# Patient Record
Sex: Female | Born: 1940 | Race: White | Hispanic: No | Smoking: Never smoker
Health system: Southern US, Community
[De-identification: ages and names within clinical notes are randomized; demographics above are authoritative.]

## PROBLEM LIST (undated history)

## (undated) DIAGNOSIS — C50919 Malignant neoplasm of unspecified site of unspecified female breast: Secondary | ICD-10-CM

## (undated) DIAGNOSIS — G9340 Encephalopathy, unspecified: Secondary | ICD-10-CM

## (undated) DIAGNOSIS — R2681 Unsteadiness on feet: Secondary | ICD-10-CM

## (undated) DIAGNOSIS — G249 Dystonia, unspecified: Secondary | ICD-10-CM

## (undated) DIAGNOSIS — W19XXXA Unspecified fall, initial encounter: Secondary | ICD-10-CM

## (undated) HISTORY — PX: TONSILLECTOMY: SUR1361

## (undated) HISTORY — PX: DEEP BRAIN STIMULATOR PLACEMENT: SHX608

## (undated) HISTORY — PX: APPENDECTOMY: SHX54

---

## 2014-06-08 ENCOUNTER — Encounter (HOSPITAL_COMMUNITY): Payer: Self-pay | Admitting: Emergency Medicine

## 2014-06-08 ENCOUNTER — Emergency Department (HOSPITAL_COMMUNITY)
Admission: EM | Admit: 2014-06-08 | Discharge: 2014-06-09 | Disposition: A | Discharge status: Home / Self Care | Payer: Medicare Other | Attending: Emergency Medicine | Admitting: Emergency Medicine

## 2014-06-08 ENCOUNTER — Emergency Department (HOSPITAL_COMMUNITY): Payer: Medicare Other

## 2014-06-08 DIAGNOSIS — Z8669 Personal history of other diseases of the nervous system and sense organs: Secondary | ICD-10-CM | POA: Diagnosis not present

## 2014-06-08 DIAGNOSIS — Y929 Unspecified place or not applicable: Secondary | ICD-10-CM | POA: Diagnosis not present

## 2014-06-08 DIAGNOSIS — Z853 Personal history of malignant neoplasm of breast: Secondary | ICD-10-CM | POA: Diagnosis not present

## 2014-06-08 DIAGNOSIS — Y9389 Activity, other specified: Secondary | ICD-10-CM | POA: Diagnosis not present

## 2014-06-08 DIAGNOSIS — W19XXXA Unspecified fall, initial encounter: Secondary | ICD-10-CM

## 2014-06-08 DIAGNOSIS — W01198A Fall on same level from slipping, tripping and stumbling with subsequent striking against other object, initial encounter: Secondary | ICD-10-CM | POA: Diagnosis not present

## 2014-06-08 DIAGNOSIS — Z9181 History of falling: Secondary | ICD-10-CM | POA: Insufficient documentation

## 2014-06-08 DIAGNOSIS — S12000K Unspecified displaced fracture of first cervical vertebra, subsequent encounter for fracture with nonunion: Secondary | ICD-10-CM | POA: Diagnosis not present

## 2014-06-08 DIAGNOSIS — S51011A Laceration without foreign body of right elbow, initial encounter: Secondary | ICD-10-CM | POA: Diagnosis not present

## 2014-06-08 DIAGNOSIS — S0990XA Unspecified injury of head, initial encounter: Secondary | ICD-10-CM | POA: Insufficient documentation

## 2014-06-08 HISTORY — DX: Unspecified fall, initial encounter: W19.XXXA

## 2014-06-08 HISTORY — DX: Unsteadiness on feet: R26.81

## 2014-06-08 HISTORY — DX: Encephalopathy, unspecified: G93.40

## 2014-06-08 HISTORY — DX: Dystonia, unspecified: G24.9

## 2014-06-08 HISTORY — DX: Malignant neoplasm of unspecified site of unspecified female breast: C50.919

## 2014-06-08 MED ORDER — HYDROMORPHONE HCL 1 MG/ML IJ SOLN
1.0000 mg | Freq: Once | INTRAMUSCULAR | Status: AC
  Administered 2014-06-08: 1 mg via INTRAMUSCULAR
  Filled 2014-06-08: qty 1

## 2014-06-08 MED ORDER — LIDOCAINE HCL (PF) 1 % IJ SOLN
INTRAMUSCULAR | Status: AC
  Filled 2014-06-08: qty 5

## 2014-06-08 MED ORDER — LIDOCAINE HCL (PF) 1 % IJ SOLN
5.0000 mL | Freq: Once | INTRAMUSCULAR | Status: AC
Start: 2014-06-08 — End: 2014-06-08
  Administered 2014-06-08: 5 mL via INTRADERMAL

## 2014-06-08 NOTE — ED Provider Notes (Addendum)
CSN: [id number]     Arrival date & time 06/08/14  2205 History   First MD Initiated Contact with Patient 06/08/14 2213     Chief Complaint  Patient presents with  . Fall     (Consider location/radiation/quality/duration/timing/severity/associated sxs/prior Treatment) HPI Comments: Patient is a 73 year old female with history of dystonia with deep brain stimulator, breast cancer, recent C1 fracture. She is brought by ambulance after a fall at the rehabilitation facility. She is getting up to go the bathroom when she lost her balance and fell and struck her head. She denies loss of consciousness but complains of headache and pain to the back of her neck. She denies any numbness or tingling. She denies any other injury.  Patient is a 73 y.o. female presenting with fall. The history is provided by the patient.  Fall This is a new problem. The current episode started less than 1 hour ago. The problem occurs constantly. The problem has not changed since onset.Associated symptoms include headaches. Pertinent negatives include no chest pain. Nothing aggravates the symptoms. Nothing relieves the symptoms. She has tried nothing for the symptoms. The treatment provided no relief.    Past Medical History  Diagnosis Date  . Dystonia   . Breast CA   . Encephalopathy   . Fall   . Unsteadiness on feet    Past Surgical History  Procedure Laterality Date  . Tonsillectomy    . Appendectomy     No family history on file. History  Substance Use Topics  . Smoking status: Never Smoker   . Smokeless tobacco: Not on file  . Alcohol Use: Yes     Comment: occassionally   OB History   Grav Para Term Preterm Abortions TAB SAB Ect Mult Living                 Review of Systems  Cardiovascular: Negative for chest pain.  Neurological: Positive for headaches.  All other systems reviewed and are negative.     Allergies  Benzoin; Latex; and Tape  Home Medications   Prior to Admission medications    Not on File   BP 149/69  Pulse 84  Temp(Src) 98.2 F (36.8 C) (Oral)  Resp 16  SpO2 100% Physical Exam  Nursing note and vitals reviewed. Constitutional: She is oriented to person, place, and time. She appears well-developed and well-nourished. No distress.  HENT:  Head: Normocephalic and atraumatic.  Eyes: EOM are normal. Pupils are equal, round, and reactive to light.  Neck: Normal range of motion. Neck supple.  Cardiovascular: Normal rate and regular rhythm.  Exam reveals no gallop and no friction rub.   No murmur heard. Pulmonary/Chest: Effort normal and breath sounds normal. No respiratory distress. She has no wheezes.  Abdominal: Soft. Bowel sounds are normal. She exhibits no distension. There is no tenderness.  Musculoskeletal: Normal range of motion.  There is a 5 cm laceration to the right elbow. She has good range of motion and pulses, motor, and sensory are intact distally  Neurological: She is alert and oriented to person, place, and time. No cranial nerve deficit. She exhibits normal muscle tone. Coordination normal.  Skin: Skin is warm and dry. She is not diaphoretic.    ED Course  Procedures (including critical care time) Labs Review Labs Reviewed - No data to display  Imaging Review No results found.   EKG Interpretation None     LACERATION REPAIR Performed by: Veryl Speak Authorized by: Veryl Speak Consent: Verbal consent obtained.  Risks and benefits: risks, benefits and alternatives were discussed Consent given by: patient Patient identity confirmed: provided demographic data Prepped and Draped in normal sterile fashion Wound explored  Laceration Location: right elbow  Laceration Length: 5cm  No Foreign Bodies seen or palpated  Anesthesia: local infiltration  Local anesthetic: lidocaine 1% without epinephrine  Anesthetic total: 5 ml  Irrigation method: syringe Amount of cleaning: standard  Skin closure: 4-0 prolene  Number of  sutures: 7  Technique: simple interrupted  Patient tolerance: Patient tolerated the procedure well with no immediate complications.   MDM   Final diagnoses:  Fall    Patient presents after a fall at her rehabilitation facility. She is there for recovery from a C1 fracture that was diagnosed in [place], [place]. She was hospitalized there then discharged to this rehabilitation facility. She woke up this evening to go to the bathroom and fell, striking her head. She denies any loss of consciousness but complains of headache. Her head CT is negative for any intracranial injury. CT of the cervical spine does reveal a C1 fracture which appears unchanged from a CT which was performed on October 15. The report from this was in her records that were sent with her. The scan was performed at an outside facility and we do not have access to these images.  I discussed this with Dr. Cyndy Freeze from neurosurgery who is recommending her wear her collar and does not feel as though any acute intervention is indicated. She will be discharged to her rehabilitation facility.    Veryl Speak, MD 06/09/14 0923  Veryl Speak, MD 06/09/14 [phone]

## 2014-06-08 NOTE — ED Notes (Signed)
Called CT and spoke to [place] to report patient is ready for transport.

## 2014-06-08 NOTE — ED Notes (Signed)
Dr. Stark Jock at the bedside with the patient.

## 2014-06-08 NOTE — ED Notes (Signed)
Pt arrives via EMS from [facility]. States that she was ambulating to bathroom with walker when she fell inbetween toilet and sink. Lac to rt elbow and abrasions to rt arm. Bruise present to back of neck and ear. Hematoma to rt head present. Small hematoma to lt head which is old from fall last week. Pt has hx frequent falls. Pt has hx C1 collar and typically wears collar. Pt was not wearing her collar at time of fall. EMS applied collar upon their arrival. 130/72 HR 74 98% RA CBG 97.

## 2014-06-08 NOTE — ED Notes (Signed)
Dr. Stark Jock is at the bedside.

## 2014-06-09 MED ORDER — HYDROCODONE-ACETAMINOPHEN 5-325 MG PO TABS: 1.0000 | ORAL_TABLET | Freq: Four times a day (QID) | ORAL | Status: AC | PRN

## 2014-06-09 NOTE — Discharge Instructions (Signed)
Hydrocodone as prescribed as needed for pain.  Continue to wear your cervical collar as before.  Followup with your neurosurgeon next week to be rechecked, and return to the ER if you develop any new or concerning symptoms.   Concussion A concussion, or closed-head injury, is a brain injury caused by a direct blow to the head or by a quick and sudden movement (jolt) of the head or neck. Concussions are usually not life-threatening. Even so, the effects of a concussion can be serious. If you have had a concussion before, you are more likely to experience concussion-like symptoms after a direct blow to the head.  CAUSES  Direct blow to the head, such as from running into another player during a soccer game, being hit in a fight, or hitting your head on a hard surface.  A jolt of the head or neck that causes the brain to move back and forth inside the skull, such as in a car crash. SIGNS AND SYMPTOMS The signs of a concussion can be hard to notice. Early on, they may be missed by you, family members, and health care providers. You may look fine but act or feel differently. Symptoms are usually temporary, but they may last for days, weeks, or even longer. Some symptoms may appear right away while others may not show up for hours or days. Every head injury is different. Symptoms include:  Mild to moderate headaches that will not go away.  A feeling of pressure inside your head.  Having more trouble than usual:  Learning or remembering things you have heard.  Answering questions.  Paying attention or concentrating.  Organizing daily tasks.  Making decisions and solving problems.  Slowness in thinking, acting or reacting, speaking, or reading.  Getting lost or being easily confused.  Feeling tired all the time or lacking energy (fatigued).  Feeling drowsy.  Sleep disturbances.  Sleeping more than usual.  Sleeping less than usual.  Trouble falling asleep.  Trouble sleeping  (insomnia).  Loss of balance or feeling lightheaded or dizzy.  Nausea or vomiting.  Numbness or tingling.  Increased sensitivity to:  Sounds.  Lights.  Distractions.  Vision problems or eyes that tire easily.  Diminished sense of taste or smell.  Ringing in the ears.  Mood changes such as feeling sad or anxious.  Becoming easily irritated or angry for little or no reason.  Lack of motivation.  Seeing or hearing things other people do not see or hear (hallucinations). DIAGNOSIS Your health care provider can usually diagnose a concussion based on a description of your injury and symptoms. He or she will ask whether you passed out (lost consciousness) and whether you are having trouble remembering events that happened right before and during your injury. Your evaluation might include:  A brain scan to look for signs of injury to the brain. Even if the test shows no injury, you may still have a concussion.  Blood tests to be sure other problems are not present. TREATMENT  Concussions are usually treated in an emergency department, in urgent care, or at a clinic. You may need to stay in the hospital overnight for further treatment.  Tell your health care provider if you are taking any medicines, including prescription medicines, over-the-counter medicines, and natural remedies. Some medicines, such as blood thinners (anticoagulants) and aspirin, may increase the chance of complications. Also tell your health care provider whether you have had alcohol or are taking illegal drugs. This information may affect treatment.  Your  health care provider will send you home with important instructions to follow.  How fast you will recover from a concussion depends on many factors. These factors include how severe your concussion is, what part of your brain was injured, your age, and how healthy you were before the concussion.  Most people with mild injuries recover fully. Recovery can  take time. In general, recovery is slower in older persons. Also, persons who have had a concussion in the past or have other medical problems may find that it takes longer to recover from their current injury. HOME CARE INSTRUCTIONS General Instructions  Carefully follow the directions your health care provider gave you.  Only take over-the-counter or prescription medicines for pain, discomfort, or fever as directed by your health care provider.  Take only those medicines that your health care provider has approved.  Do not drink alcohol until your health care provider says you are well enough to do so. Alcohol and certain other drugs may slow your recovery and can put you at risk of further injury.  If it is harder than usual to remember things, write them down.  If you are easily distracted, try to do one thing at a time. For example, do not try to watch TV while fixing dinner.  Talk with family members or close friends when making important decisions.  Keep all follow-up appointments. Repeated evaluation of your symptoms is recommended for your recovery.  Watch your symptoms and tell others to do the same. Complications sometimes occur after a concussion. Older adults with a brain injury may have a higher risk of serious complications, such as a blood clot on the brain.  Tell your teachers, school nurse, school counselor, coach, athletic trainer, or work Freight forwarder about your injury, symptoms, and restrictions. Tell them about what you can or cannot do. They should watch for:  Increased problems with attention or concentration.  Increased difficulty remembering or learning new information.  Increased time needed to complete tasks or assignments.  Increased irritability or decreased ability to cope with stress.  Increased symptoms.  Rest. Rest helps the brain to heal. Make sure you:  Get plenty of sleep at night. Avoid staying up late at night.  Keep the same bedtime hours on  weekends and weekdays.  Rest during the day. Take daytime naps or rest breaks when you feel tired.  Limit activities that require a lot of thought or concentration. These include:  Doing homework or job-related work.  Watching TV.  Working on the computer.  Avoid any situation where there is potential for another head injury (football, hockey, soccer, basketball, martial arts, downhill snow sports and horseback riding). Your condition will get worse every time you experience a concussion. You should avoid these activities until you are evaluated by the appropriate follow-up health care providers. Returning To Your Regular Activities You will need to return to your normal activities slowly, not all at once. You must give your body and brain enough time for recovery.  Do not return to sports or other athletic activities until your health care provider tells you it is safe to do so.  Ask your health care provider when you can drive, ride a bicycle, or operate heavy machinery. Your ability to react may be slower after a brain injury. Never do these activities if you are dizzy.  Ask your health care provider about when you can return to work or school. Preventing Another Concussion It is very important to avoid another brain injury, especially  before you have recovered. In rare cases, another injury can lead to permanent brain damage, brain swelling, or death. The risk of this is greatest during the first 7-10 days after a head injury. Avoid injuries by:  Wearing a seat belt when riding in a car.  Drinking alcohol only in moderation.  Wearing a helmet when biking, skiing, skateboarding, skating, or doing similar activities.  Avoiding activities that could lead to a second concussion, such as contact or recreational sports, until your health care provider says it is okay.  Taking safety measures in your home.  Remove clutter and tripping hazards from floors and stairways.  Use grab bars  in bathrooms and handrails by stairs.  Place non-slip mats on floors and in bathtubs.  Improve lighting in dim areas. SEEK MEDICAL CARE IF:  You have increased problems paying attention or concentrating.  You have increased difficulty remembering or learning new information.  You need more time to complete tasks or assignments than before.  You have increased irritability or decreased ability to cope with stress.  You have more symptoms than before. Seek medical care if you have any of the following symptoms for more than 2 weeks after your injury:  Lasting (chronic) headaches.  Dizziness or balance problems.  Nausea.  Vision problems.  Increased sensitivity to noise or light.  Depression or mood swings.  Anxiety or irritability.  Memory problems.  Difficulty concentrating or paying attention.  Sleep problems.  Feeling tired all the time. SEEK IMMEDIATE MEDICAL CARE IF:  You have severe or worsening headaches. These may be a sign of a blood clot in the brain.  You have weakness (even if only in one hand, leg, or part of the face).  You have numbness.  You have decreased coordination.  You vomit repeatedly.  You have increased sleepiness.  One pupil is larger than the other.  You have convulsions.  You have slurred speech.  You have increased confusion. This may be a sign of a blood clot in the brain.  You have increased restlessness, agitation, or irritability.  You are unable to recognize people or places.  You have neck pain.  It is difficult to wake you up.  You have unusual behavior changes.  You lose consciousness. MAKE SURE YOU:  Understand these instructions.  Will watch your condition.  Will get help right away if you are not doing well or get worse. Document Released: 10/24/2003 Document Revised: 08/08/2013 Document Reviewed: 02/23/2013 [facility] Patient Information 2015 [place], [place]. This information is not intended to  replace advice given to you by your health care provider. Make sure you discuss any questions you have with your health care provider.

## 2014-06-09 NOTE — ED Notes (Signed)
Patient using the bedpan ?

## 2014-06-14 ENCOUNTER — Encounter (HOSPITAL_COMMUNITY): Payer: Self-pay | Admitting: Emergency Medicine

## 2014-06-14 ENCOUNTER — Emergency Department (HOSPITAL_COMMUNITY)
Admission: EM | Admit: 2014-06-14 | Discharge: 2014-06-15 | Disposition: A | Discharge status: Home Health | Payer: Medicare Other | Attending: Emergency Medicine | Admitting: Emergency Medicine

## 2014-06-14 ENCOUNTER — Emergency Department (HOSPITAL_COMMUNITY): Payer: Medicare Other

## 2014-06-14 DIAGNOSIS — S02119D Unspecified fracture of occiput, subsequent encounter for fracture with routine healing: Secondary | ICD-10-CM

## 2014-06-14 DIAGNOSIS — Y9389 Activity, other specified: Secondary | ICD-10-CM | POA: Insufficient documentation

## 2014-06-14 DIAGNOSIS — R296 Repeated falls: Secondary | ICD-10-CM | POA: Insufficient documentation

## 2014-06-14 DIAGNOSIS — Z91040 Latex allergy status: Secondary | ICD-10-CM | POA: Diagnosis not present

## 2014-06-14 DIAGNOSIS — R4789 Other speech disturbances: Secondary | ICD-10-CM | POA: Diagnosis not present

## 2014-06-14 DIAGNOSIS — W1839XA Other fall on same level, initial encounter: Secondary | ICD-10-CM | POA: Insufficient documentation

## 2014-06-14 DIAGNOSIS — S0181XA Laceration without foreign body of other part of head, initial encounter: Secondary | ICD-10-CM | POA: Diagnosis present

## 2014-06-14 DIAGNOSIS — Z853 Personal history of malignant neoplasm of breast: Secondary | ICD-10-CM | POA: Diagnosis not present

## 2014-06-14 DIAGNOSIS — Z8669 Personal history of other diseases of the nervous system and sense organs: Secondary | ICD-10-CM | POA: Diagnosis not present

## 2014-06-14 DIAGNOSIS — R2681 Unsteadiness on feet: Secondary | ICD-10-CM | POA: Diagnosis not present

## 2014-06-14 DIAGNOSIS — Z792 Long term (current) use of antibiotics: Secondary | ICD-10-CM | POA: Insufficient documentation

## 2014-06-14 DIAGNOSIS — Z79899 Other long term (current) drug therapy: Secondary | ICD-10-CM | POA: Insufficient documentation

## 2014-06-14 DIAGNOSIS — Y92198 Other place in other specified residential institution as the place of occurrence of the external cause: Secondary | ICD-10-CM | POA: Diagnosis not present

## 2014-06-14 DIAGNOSIS — S12000D Unspecified displaced fracture of first cervical vertebra, subsequent encounter for fracture with routine healing: Secondary | ICD-10-CM

## 2014-06-14 DIAGNOSIS — W19XXXA Unspecified fall, initial encounter: Secondary | ICD-10-CM

## 2014-06-14 MED ORDER — LORAZEPAM 2 MG/ML IJ SOLN
0.5000 mg | Freq: Once | INTRAMUSCULAR | Status: AC
  Administered 2014-06-14: 0.5 mg via INTRAMUSCULAR
  Filled 2014-06-14: qty 1

## 2014-06-14 MED ORDER — ROPINIROLE HCL 0.5 MG PO TABS
0.5000 mg | ORAL_TABLET | Freq: Once | ORAL | Status: AC
  Administered 2014-06-15: 0.5 mg via ORAL
  Filled 2014-06-14: qty 1

## 2014-06-14 MED ORDER — LIDOCAINE HCL (PF) 1 % IJ SOLN
30.0000 mL | Freq: Once | INTRAMUSCULAR | Status: AC
  Administered 2014-06-15: 30 mL
  Filled 2014-06-14: qty 30

## 2014-06-14 NOTE — ED Notes (Signed)
Per [facility] - pt from [facility] - pt uses a walker to ambulate, this evening while ambulating pt reached down to pick something up off the floor causing pt to fall, pt sustained laceration to forehead d/t glasses. Pt A&O on arrival, EMS report facility staff state pt is at her baseline mental status and has been since the fall. Pt also wearing a soft c-collar d/t previous neck injury. Pt denies neck or back pain.

## 2014-06-15 DIAGNOSIS — S0181XA Laceration without foreign body of other part of head, initial encounter: Secondary | ICD-10-CM | POA: Diagnosis not present

## 2014-06-15 MED ORDER — LORAZEPAM 2 MG/ML IJ SOLN
1.0000 mg | Freq: Once | INTRAMUSCULAR | Status: AC
  Administered 2014-06-15: 1 mg via INTRAMUSCULAR
  Filled 2014-06-15: qty 1

## 2014-06-15 NOTE — ED Notes (Signed)
Pt transported back to [facility] by [facility].

## 2014-06-15 NOTE — Discharge Instructions (Signed)
Fall Prevention and Home Safety Falls cause injuries and can affect all age groups. It is possible to use preventive measures to significantly decrease the likelihood of falls. There are many simple measures which can make your home safer and prevent falls. OUTDOORS  Repair cracks and edges of walkways and driveways.  Remove high doorway thresholds.  Trim shrubbery on the main path into your home.  Have good outside lighting.  Clear walkways of tools, rocks, debris, and clutter.  Check that handrails are not broken and are securely fastened. Both sides of steps should have handrails.  Have leaves, snow, and ice cleared regularly.  Use sand or salt on walkways during winter months.  In the garage, clean up grease or oil spills. BATHROOM  Install night lights.  Install grab bars by the toilet and in the tub and shower.  Use non-skid mats or decals in the tub or shower.  Place a plastic non-slip stool in the shower to sit on, if needed.  Keep floors dry and clean up all water on the floor immediately.  Remove soap buildup in the tub or shower on a regular basis.  Secure bath mats with non-slip, double-sided rug tape.  Remove throw rugs and tripping hazards from the floors. BEDROOMS  Install night lights.  Make sure a bedside light is easy to reach.  Do not use oversized bedding.  Keep a telephone by your bedside.  Have a firm chair with side arms to use for getting dressed.  Remove throw rugs and tripping hazards from the floor. KITCHEN  Keep handles on pots and pans turned toward the center of the stove. Use back burners when possible.  Clean up spills quickly and allow time for drying.  Avoid walking on wet floors.  Avoid hot utensils and knives.  Position shelves so they are not too high or low.  Place commonly used objects within easy reach.  If necessary, use a sturdy step stool with a grab bar when reaching.  Keep electrical cables out of the  way.  Do not use floor polish or wax that makes floors slippery. If you must use wax, use non-skid floor wax.  Remove throw rugs and tripping hazards from the floor. STAIRWAYS  Never leave objects on stairs.  Place handrails on both sides of stairways and use them. Fix any loose handrails. Make sure handrails on both sides of the stairways are as long as the stairs.  Check carpeting to make sure it is firmly attached along stairs. Make repairs to worn or loose carpet promptly.  Avoid placing throw rugs at the top or bottom of stairways, or properly secure the rug with carpet tape to prevent slippage. Get rid of throw rugs, if possible.  Have an electrician put in a light switch at the top and bottom of the stairs. OTHER FALL PREVENTION TIPS  Wear low-heel or rubber-soled shoes that are supportive and fit well. Wear closed toe shoes.  When using a stepladder, make sure it is fully opened and both spreaders are firmly locked. Do not climb a closed stepladder.  Add color or contrast paint or tape to grab bars and handrails in your home. Place contrasting color strips on first and last steps.  Learn and use mobility aids as needed. Install an electrical emergency response system.  Turn on lights to avoid dark areas. Replace light bulbs that burn out immediately. Get light switches that glow.  Arrange furniture to create clear pathways. Keep furniture in the same place.  Firmly attach carpet with non-skid or double-sided tape. °· Eliminate uneven floor surfaces. °· Select a carpet pattern that does not visually hide the edge of steps. °· Be aware of all pets. °OTHER HOME SAFETY TIPS °· Set the water temperature for 120° F (48.8° C). °· Keep emergency numbers on or near the telephone. °· Keep smoke detectors on every level of the home and near sleeping areas. °Document Released: 07/24/2002 Document Revised: 02/02/2012 Document Reviewed: 10/23/2011 °[employer]® Patient Information ©2015  [employer]. This information is not intended to replace advice given to you by your health care provider. Make sure you discuss any questions you have with your health care provider. ° ° °Facial Laceration °A facial laceration is a cut on the face. These injuries can be painful and cause bleeding. Some cuts may need to be closed with stitches (sutures), skin adhesive strips, or wound glue. Cuts usually heal quickly but can leave a scar. It can take 1-2 years for the scar to go away completely. °HOME CARE  °· Only take medicines as told by your doctor. °· Follow your doctor's instructions for wound care. °For Stitches: °· Keep the cut clean and dry. °· If you have a bandage (dressing), change it at least once a day. Change the bandage if it gets wet or dirty, or as told by your doctor. °· Wash the cut with soap and water 2 times a day. Rinse the cut with water. Pat it dry with a clean towel. °· Put a thin layer of medicated cream on the cut as told by your doctor. °· You may shower after the first 24 hours. Do not soak the cut in water until the stitches are removed. °· Have your stitches removed as told by your doctor. °· Do not wear any makeup until a few days after your stitches are removed. °For Skin Adhesive Strips: °· Keep the cut clean and dry. °· Do not get the strips wet. You may take a bath, but be careful to keep the cut dry. °· If the cut gets wet, pat it dry with a clean towel. °· The strips will fall off on their own. Do not remove the strips that are still stuck to the cut. °For Wound Glue: °· You may shower or take baths. Do not soak or scrub the cut. Do not swim. Avoid heavy sweating until the glue falls off on its own. After a shower or bath, pat the cut dry with a clean towel. °· Do not put medicine or makeup on your cut until the glue falls off. °· If you have a bandage, do not put tape over the glue. °· Avoid lots of sunlight or tanning lamps until the glue falls off. °· The glue will fall off  on its own in 5-10 days. Do not pick at the glue. °After Healing: °Put sunscreen on the cut for the first year to reduce your scar. °GET HELP RIGHT AWAY IF:  °· Your cut area gets red, painful, or puffy (swollen). °· You see a yellowish-white fluid (pus) coming from the cut. °· You have chills or a fever. °MAKE SURE YOU:  °· Understand these instructions. °· Will watch your condition. °· Will get help right away if you are not doing well or get worse. °Document Released: 01/20/2008 Document Revised: 05/24/2013 Document Reviewed: 03/16/2013 °Exit[employer]® Patient Information ©2015 [employer]. This information is not intended to replace advice given to you by your health care provider. Make sure you discuss any questions   you have with your health care provider. ° °

## 2014-06-15 NOTE — ED Notes (Signed)
Patient got off the end of the bed and got on her knees and laid in floor.

## 2014-06-15 NOTE — ED Provider Notes (Signed)
CSN: [id number]     Arrival date & time 06/14/14  2222 History   First MD Initiated Contact with Patient 06/14/14 2226     Chief Complaint  Patient presents with  . Fall  . Facial Laceration   Patient is a 73 y.o. female presenting with fall.  Fall Pertinent negatives include no chest pain, chills, fatigue, fever, headaches, nausea, numbness, vomiting or weakness.    Patient is a 73 y.o. Female with history of dystonia with deep brain stimulator, breast cancer, and recent C1 fracture after a mechanical fall who presents to the ED after a fall from her rehab facility.  Patient states that she was trying to pick something up off the floor when she fell and landed on her face.  Patient has a history of frequent mechanical falls.  She was seen on 06/08/14 for a fall at that time.  Patient does ambulate with a walker.  Patient did not have any loss of consciousness.  Patient is not on blood thinners at this time.  Patient does have a new laceration to the forehead.  Patient's tetanus is up to date.    Past Medical History  Diagnosis Date  . Dystonia   . Breast CA   . Encephalopathy   . Fall   . Unsteadiness on feet    Past Surgical History  Procedure Laterality Date  . Tonsillectomy    . Appendectomy     History reviewed. No pertinent family history. History  Substance Use Topics  . Smoking status: Never Smoker   . Smokeless tobacco: Not on file  . Alcohol Use: Yes     Comment: occassionally   OB History   Grav Para Term Preterm Abortions TAB SAB Ect Mult Living                 Review of Systems  Constitutional: Negative for fever, chills and fatigue.  Eyes: Negative for visual disturbance.  Respiratory: Negative for chest tightness and shortness of breath.   Cardiovascular: Negative for chest pain and palpitations.  Gastrointestinal: Negative for nausea and vomiting.  Neurological: Positive for tremors. Negative for dizziness, syncope, speech difficulty, weakness,  light-headedness, numbness and headaches.  All other systems reviewed and are negative.     Allergies  Benzoin; Latex; and Tape  Home Medications   Prior to Admission medications   Medication Sig Start Date End Date Taking? Authorizing Provider  ampicillin (PRINCIPEN) 500 MG capsule Take 500 mg by mouth 4 (four) times daily.    Historical Provider, MD  ascorbic acid (VITAMIN C) 500 MG tablet Take 500 mg by mouth daily.    Historical Provider, MD  buPROPion (WELLBUTRIN XL) 300 MG 24 hr tablet Take 300 mg by mouth daily.    Historical Provider, MD  Calcium-Vitamin D 600-200 MG-UNIT per tablet Take 1 tablet by mouth 4 (four) times daily.    Historical Provider, MD  clonazePAM (KLONOPIN) 1 MG tablet Take 1 mg by mouth 3 (three) times daily.    Historical Provider, MD  ferrous sulfate 325 (65 FE) MG EC tablet Take 325 mg by mouth daily with breakfast.    Historical Provider, MD  HYDROcodone-acetaminophen (NORCO) 5-325 MG per tablet Take 1-2 tablets by mouth every 6 (six) hours as needed. 06/09/14   Veryl Speak, MD  levETIRAcetam (KEPPRA) 500 MG tablet Take 500 mg by mouth 2 (two) times daily.    Historical Provider, MD  magnesium oxide (MAG-OX) 400 MG tablet Take 400 mg by mouth daily.  Historical Provider, MD  omeprazole (PRILOSEC) 20 MG capsule Take 20 mg by mouth 2 (two) times daily before a meal.    Historical Provider, MD  oxybutynin (DITROPAN-XL) 5 MG 24 hr tablet Take 5 mg by mouth at bedtime.    Historical Provider, MD  tamoxifen (NOLVADEX) 20 MG tablet Take 20 mg by mouth daily.    Historical Provider, MD  venlafaxine XR (EFFEXOR-XR) 75 MG 24 hr capsule Take 75 mg by mouth at bedtime.    Historical Provider, MD  vitamin B-12 (CYANOCOBALAMIN) 1000 MCG tablet Take 1,000 mcg by mouth daily.    Historical Provider, MD   BP 128/94  Pulse 69  Temp(Src) 98.3 F (36.8 C) (Oral)  Resp 20  SpO2 100% Physical Exam  Nursing note and vitals reviewed. Constitutional: She is oriented to  person, place, and time. She appears well-developed and well-nourished. No distress. Cervical collar in place.  HENT:  Head: Normocephalic.  Nose: Nose normal.  Mouth/Throat: Oropharynx is clear and moist. No oropharyngeal exudate.  Curvelinear laceration to the frontal forehead with active bleeding.  There are no foreign bodies.    Eyes: Conjunctivae and EOM are normal. Pupils are equal, round, and reactive to light. No scleral icterus.  Neck: Normal range of motion. Neck supple. No JVD present. No thyromegaly present.  Cardiovascular: Normal rate, regular rhythm, normal heart sounds and intact distal pulses.  Exam reveals no gallop and no friction rub.   No murmur heard. Pulmonary/Chest: Effort normal and breath sounds normal. No respiratory distress. She has no wheezes. She has no rales. She exhibits no tenderness.  Abdominal: Soft. Bowel sounds are normal. She exhibits no distension and no mass. There is no tenderness. There is no rebound and no guarding.  Musculoskeletal: Normal range of motion.  Lymphadenopathy:    She has no cervical adenopathy.  Neurological: She is alert and oriented to person, place, and time. She has normal strength. No cranial nerve deficit or sensory deficit. Coordination normal.  Negative pronator drift of arms and legs  Skin: Skin is warm and dry. She is not diaphoretic.  Psychiatric: Her behavior is normal. Judgment and thought content normal. Her affect is labile. Her speech is rapid and/or pressured. Cognition and memory are normal.    ED Course  LACERATION REPAIR Date/Time: 06/15/2014 5:11 AM Performed by: Starlyn Skeans A Authorized by: Starlyn Skeans A Consent: Verbal consent obtained. Consent given by: patient Patient understanding: patient states understanding of the procedure being performed Patient consent: the patient's understanding of the procedure matches consent given Procedure consent: procedure consent matches procedure  scheduled Relevant documents: relevant documents present and verified Test results: test results available and properly labeled Site marked: the operative site was marked Imaging studies: imaging studies available Patient identity confirmed: verbally with patient Time out: Immediately prior to procedure a "time out" was called to verify the correct patient, procedure, equipment, support staff and site/side marked as required. Body area: head/neck Location details: forehead Laceration length: 5 cm Foreign bodies: no foreign bodies Tendon involvement: none Nerve involvement: none Vascular damage: no Anesthesia: local infiltration Local anesthetic: lidocaine 1% without epinephrine Anesthetic total: 8 ml Patient sedated: no Preparation: Patient was prepped and draped in the usual sterile fashion. Irrigation solution: saline Irrigation method: syringe Amount of cleaning: standard Debridement: none Degree of undermining: none Skin closure: Ethilon (5-0) Number of sutures: 7 Technique: simple Approximation: close Approximation difficulty: simple Patient tolerance: Patient tolerated the procedure well with no immediate complications.   (including critical care  time) Labs Review Labs Reviewed - No data to display  Imaging Review Ct Head Wo Contrast  06/15/2014   CLINICAL DATA:  Fall with forehead lacerations. History of previous neck injury. No neck pain currently.  EXAM: CT HEAD WITHOUT CONTRAST  CT CERVICAL SPINE WITHOUT CONTRAST  TECHNIQUE: Multidetector CT imaging of the head and cervical spine was performed following the standard protocol without intravenous contrast. Multiplanar CT image reconstructions of the cervical spine were also generated.  COMPARISON:  06/08/2014  FINDINGS: CT HEAD FINDINGS  Skull and Sinuses:Remote fracture of the right nasal arch.  Recent, nondepressed vertical fracture through the central occipital bone, without continuation to the foramen magnum or  condyles.  Bur holes for deep brain stimulator placement.  Orbits: No acute abnormality.  Brain: No evidence of acute abnormality, such as acute infarction, hemorrhage, hydrocephalus, or mass lesion/mass effect. The deep brain stimulator leads bilaterally appear intact on scout imaging. No evidence of displacement. Cerebral volume loss, most prominent in the frontal lobes.  CT CERVICAL SPINE FINDINGS  Posterior right first rib fracture.  Re- demonstrated fractures through the anterior arch and right posterior arch of C1. Unchanged alignment, with widening of the anterior arch of C1 by 3 mm. The fracture margins appear chronic/corticated.  No acute fracture or traumatic malalignment identified.  C3-4 and C4-5 anterior cervical discectomy with ventral plate and screw fixation. Bony fusion is complete. Advanced degenerative disc disease, with marked disc narrowing at C5-6 and C6-7.  IMPRESSION: 1. When compared to 06/08/2014, no new injury identified in the head or cervical spine. 2. Nondepressed occipital bone fracture. 3. Nondisplaced right first rib fracture. 4. Remote appearing C1 anterior and posterior ring fractures without osseous fusion.   Electronically Signed   By: Jorje Guild M.D.   On: 06/15/2014 01:52   Ct Cervical Spine Wo Contrast  06/15/2014   CLINICAL DATA:  Fall with forehead lacerations. History of previous neck injury. No neck pain currently.  EXAM: CT HEAD WITHOUT CONTRAST  CT CERVICAL SPINE WITHOUT CONTRAST  TECHNIQUE: Multidetector CT imaging of the head and cervical spine was performed following the standard protocol without intravenous contrast. Multiplanar CT image reconstructions of the cervical spine were also generated.  COMPARISON:  06/08/2014  FINDINGS: CT HEAD FINDINGS  Skull and Sinuses:Remote fracture of the right nasal arch.  Recent, nondepressed vertical fracture through the central occipital bone, without continuation to the foramen magnum or condyles.  Bur holes for deep  brain stimulator placement.  Orbits: No acute abnormality.  Brain: No evidence of acute abnormality, such as acute infarction, hemorrhage, hydrocephalus, or mass lesion/mass effect. The deep brain stimulator leads bilaterally appear intact on scout imaging. No evidence of displacement. Cerebral volume loss, most prominent in the frontal lobes.  CT CERVICAL SPINE FINDINGS  Posterior right first rib fracture.  Re- demonstrated fractures through the anterior arch and right posterior arch of C1. Unchanged alignment, with widening of the anterior arch of C1 by 3 mm. The fracture margins appear chronic/corticated.  No acute fracture or traumatic malalignment identified.  C3-4 and C4-5 anterior cervical discectomy with ventral plate and screw fixation. Bony fusion is complete. Advanced degenerative disc disease, with marked disc narrowing at C5-6 and C6-7.  IMPRESSION: 1. When compared to 06/08/2014, no new injury identified in the head or cervical spine. 2. Nondepressed occipital bone fracture. 3. Nondisplaced right first rib fracture. 4. Remote appearing C1 anterior and posterior ring fractures without osseous fusion.   Electronically Signed   By: Jorje Guild  M.D.   On: 06/15/2014 01:52     EKG Interpretation None      MDM   Final diagnoses:  Fall  Facial laceration, initial encounter  C1 cervical fracture, with routine healing, subsequent encounter  Occipital fracture, with routine healing, subsequent encounter   Patient is a 72 y.o. Female who presents to the ED after fall.  Physical exam reveals alert and oriented female with facial laceration.  There are no focal neurological deficits.  Patient does have history of dystonia and has restless legs here in the ED.  Patient given requip and ativan with some relief of leg symptoms.  CT head and neck reveal no new injuries at this time.  Again there is noted a mildly depressed occipital skull fracture, first rib fracture, and C1 fracture.  There is no  evidence for acute brain bleeding secondary to trauma.  Patient is stable for discharge at this time.  Patient was seen by Dr. Tomi Bamberger and was discussed with Dr. Dina Rich who agree with the above plan and workup.  Patient is currently doing physical therapy at her rehab facility.  Patient to be discharged back to her rehab facility at this time.  Sutures need to be removed in 3-5 days.  Patient to return for signs of infection or any other concerning symptoms.      Cherylann Parr, PA-C 06/15/14 [phone]

## 2014-06-15 NOTE — ED Provider Notes (Signed)
Medical screening examination/treatment/procedure(s) were performed by non-physician practitioner and as supervising physician I was immediately available for consultation/collaboration.   EKG Interpretation None        Merryl Hacker, MD 06/15/14 1950

## 2014-06-15 NOTE — ED Notes (Signed)
Report given to Nursing home.

## 2014-06-15 NOTE — ED Notes (Signed)
Ambulated well

## 2014-07-01 ENCOUNTER — Encounter (HOSPITAL_COMMUNITY): Payer: Self-pay | Admitting: Emergency Medicine

## 2014-07-01 ENCOUNTER — Emergency Department (HOSPITAL_COMMUNITY): Payer: Medicare Other

## 2014-07-01 ENCOUNTER — Emergency Department (HOSPITAL_COMMUNITY)
Admission: EM | Admit: 2014-07-01 | Discharge: 2014-07-01 | Disposition: A | Discharge status: Home / Self Care | Payer: Medicare Other | Attending: Emergency Medicine | Admitting: Emergency Medicine

## 2014-07-01 DIAGNOSIS — Z79899 Other long term (current) drug therapy: Secondary | ICD-10-CM | POA: Diagnosis not present

## 2014-07-01 DIAGNOSIS — S199XXA Unspecified injury of neck, initial encounter: Secondary | ICD-10-CM | POA: Insufficient documentation

## 2014-07-01 DIAGNOSIS — Z9181 History of falling: Secondary | ICD-10-CM | POA: Insufficient documentation

## 2014-07-01 DIAGNOSIS — Z8669 Personal history of other diseases of the nervous system and sense organs: Secondary | ICD-10-CM | POA: Diagnosis not present

## 2014-07-01 DIAGNOSIS — Y998 Other external cause status: Secondary | ICD-10-CM | POA: Diagnosis not present

## 2014-07-01 DIAGNOSIS — Y9389 Activity, other specified: Secondary | ICD-10-CM | POA: Diagnosis not present

## 2014-07-01 DIAGNOSIS — S4992XA Unspecified injury of left shoulder and upper arm, initial encounter: Secondary | ICD-10-CM | POA: Diagnosis not present

## 2014-07-01 DIAGNOSIS — R296 Repeated falls: Secondary | ICD-10-CM | POA: Insufficient documentation

## 2014-07-01 DIAGNOSIS — Z91040 Latex allergy status: Secondary | ICD-10-CM | POA: Insufficient documentation

## 2014-07-01 DIAGNOSIS — Y9289 Other specified places as the place of occurrence of the external cause: Secondary | ICD-10-CM | POA: Insufficient documentation

## 2014-07-01 DIAGNOSIS — Z853 Personal history of malignant neoplasm of breast: Secondary | ICD-10-CM | POA: Insufficient documentation

## 2014-07-01 DIAGNOSIS — S0990XA Unspecified injury of head, initial encounter: Secondary | ICD-10-CM | POA: Diagnosis present

## 2014-07-01 DIAGNOSIS — G249 Dystonia, unspecified: Secondary | ICD-10-CM | POA: Diagnosis not present

## 2014-07-01 DIAGNOSIS — S1201XD Stable burst fracture of first cervical vertebra, subsequent encounter for fracture with routine healing: Secondary | ICD-10-CM | POA: Diagnosis not present

## 2014-07-01 DIAGNOSIS — W010XXA Fall on same level from slipping, tripping and stumbling without subsequent striking against object, initial encounter: Secondary | ICD-10-CM | POA: Diagnosis not present

## 2014-07-01 DIAGNOSIS — W19XXXA Unspecified fall, initial encounter: Secondary | ICD-10-CM

## 2014-07-01 LAB — BASIC METABOLIC PANEL
Anion gap: 12 (ref 5–15)
BUN: 20 mg/dL (ref 6–23)
CHLORIDE: 101 meq/L (ref 96–112)
CO2: 26 meq/L (ref 19–32)
CREATININE: 0.89 mg/dL (ref 0.50–1.10)
Calcium: 9 mg/dL (ref 8.4–10.5)
GFR calc Af Amer: 74 mL/min — ABNORMAL LOW (ref >90)
GFR calc non Af Amer: 64 mL/min — ABNORMAL LOW (ref >90)
Glucose, Bld: 92 mg/dL (ref 70–99)
Potassium: 5 mEq/L (ref 3.7–5.3)
Sodium: 139 mEq/L (ref 137–147)

## 2014-07-01 LAB — URINALYSIS, ROUTINE W REFLEX MICROSCOPIC
BILIRUBIN URINE: NEGATIVE
GLUCOSE, UA: NEGATIVE mg/dL
HGB URINE DIPSTICK: NEGATIVE
KETONES UR: NEGATIVE mg/dL
Nitrite: NEGATIVE
PROTEIN: NEGATIVE mg/dL
Specific Gravity, Urine: 1.012 (ref 1.005–1.030)
Urobilinogen, UA: 0.2 mg/dL (ref 0.0–1.0)
pH: 7 (ref 5.0–8.0)

## 2014-07-01 LAB — CBC WITH DIFFERENTIAL/PLATELET
BASOS PCT: 1 % (ref 0–1)
Basophils Absolute: 0 10*3/uL (ref 0.0–0.1)
EOS ABS: 0.1 10*3/uL (ref 0.0–0.7)
Eosinophils Relative: 2 % (ref 0–5)
HEMATOCRIT: 41.3 % (ref 36.0–46.0)
Hemoglobin: 13.5 g/dL (ref 12.0–15.0)
Lymphocytes Relative: 37 % (ref 12–46)
Lymphs Abs: 2 10*3/uL (ref 0.7–4.0)
MCH: 32.1 pg (ref 26.0–34.0)
MCHC: 32.7 g/dL (ref 30.0–36.0)
MCV: 98.3 fL (ref 78.0–100.0)
MONOS PCT: 11 % (ref 3–12)
Monocytes Absolute: 0.6 10*3/uL (ref 0.1–1.0)
NEUTROS ABS: 2.8 10*3/uL (ref 1.7–7.7)
Neutrophils Relative %: 49 % (ref 43–77)
Platelets: 251 10*3/uL (ref 150–400)
RBC: 4.2 MIL/uL (ref 3.87–5.11)
RDW: 12.3 % (ref 11.5–15.5)
WBC: 5.6 10*3/uL (ref 4.0–10.5)

## 2014-07-01 LAB — URINE MICROSCOPIC-ADD ON

## 2014-07-01 MED ORDER — ROPINIROLE HCL 1 MG PO TABS
2.0000 mg | ORAL_TABLET | Freq: Once | ORAL | Status: AC
  Administered 2014-07-01: 2 mg via ORAL
  Filled 2014-07-01: qty 2

## 2014-07-01 MED ORDER — LORAZEPAM 2 MG/ML IJ SOLN
0.5000 mg | Freq: Once | INTRAMUSCULAR | Status: AC
  Administered 2014-07-01: 0.5 mg via INTRAVENOUS
  Filled 2014-07-01: qty 1

## 2014-07-01 MED ORDER — SODIUM CHLORIDE 0.9 % IV BOLUS (SEPSIS)
500.0000 mL | Freq: Once | INTRAVENOUS | Status: AC
  Administered 2014-07-01: 500 mL via INTRAVENOUS

## 2014-07-01 NOTE — ED Notes (Signed)
PTAR at Bedside.

## 2014-07-01 NOTE — ED Notes (Signed)
Pt dressed and waiting for ptar in bed. Pt monitored by pulse ox and bp cuff.

## 2014-07-01 NOTE — ED Notes (Signed)
Pt waiting on [facility] to transport her back to SNF

## 2014-07-01 NOTE — Discharge Instructions (Signed)
Your neck fracture appears stable.  Continue as instructed by neurosurgeon and follow up as planned.  Do not walk without assistance.

## 2014-07-01 NOTE — ED Notes (Signed)
IV removed Per RN.

## 2014-07-01 NOTE — ED Notes (Signed)
Received pt from [facility] with c/o trying to get out of bed, slip and fall onto tile floor. Pt has hematoma to occipital area. Pt c/o left side of neck pain and left shoulder pain. Pt denies +LOC. Pt has history of multiple falls and had a c-collar in place from previous fall.

## 2014-07-01 NOTE — ED Notes (Signed)
Waiting on medicine from pharmacy

## 2014-07-01 NOTE — ED Provider Notes (Signed)
CSN: [id number]     Arrival date & time 07/01/14  1330 History   First MD Initiated Contact with Patient 07/01/14 1338     Chief Complaint  Patient presents with  . Fall  . Head Injury  . Neck Pain  . Shoulder Pain     (Consider location/radiation/quality/duration/timing/severity/associated sxs/prior Treatment) HPI  73 y.o. Female from [place] [facility] facility presents complaining of fall today. She has had multiple falls in the past. She states that she slipped going to get her slippers. She fell backwards and struck the back of her head. She complains of some head and neck pain. She denies any lateralized weakness. She has some left shoulder pain but states this has been there chronically. She is transferred by [facility] with a cervical collar in place. Old records show a recent C1 fracture. She has been seen twice here once on October 23 and October 30 for falls. She does not report any change in her recent health that would have caused her to have syncope or fainting. She reports that she knows that she has balance issues and normally would have put something on her feet but had to reach her slippers. She denies any visual change, chest pain, shortness of breath, abdominal pain, vomiting, or lower extremity injury.  Past Medical History  Diagnosis Date  . Dystonia   . Breast CA   . Encephalopathy   . Fall   . Unsteadiness on feet    Past Surgical History  Procedure Laterality Date  . Tonsillectomy    . Appendectomy    . Deep brain stimulator placement     No family history on file. History  Substance Use Topics  . Smoking status: Never Smoker   . Smokeless tobacco: Not on file  . Alcohol Use: Yes     Comment: occassionally   OB History    No data available     Review of Systems  Constitutional: Negative.   HENT: Negative.   Eyes: Negative.   Respiratory: Negative.   Cardiovascular: Negative.   Gastrointestinal: Negative.   Endocrine: Negative.    Genitourinary: Negative.   Neurological: Negative for numbness.  Hematological: Negative for adenopathy.  Psychiatric/Behavioral: Negative.   All other systems reviewed and are negative.     Allergies  Benzoin; Latex; and Tape  Home Medications   Prior to Admission medications   Medication Sig Start Date End Date Taking? Authorizing Provider  ampicillin (PRINCIPEN) 500 MG capsule Take 500 mg by mouth 4 (four) times daily.    Historical Provider, MD  ascorbic acid (VITAMIN C) 500 MG tablet Take 500 mg by mouth daily.    Historical Provider, MD  buPROPion (WELLBUTRIN XL) 300 MG 24 hr tablet Take 300 mg by mouth daily.    Historical Provider, MD  Calcium-Vitamin D 600-200 MG-UNIT per tablet Take 1 tablet by mouth 4 (four) times daily.    Historical Provider, MD  clonazePAM (KLONOPIN) 1 MG tablet Take 1 mg by mouth 3 (three) times daily.    Historical Provider, MD  ferrous sulfate 325 (65 FE) MG EC tablet Take 325 mg by mouth daily with breakfast.    Historical Provider, MD  HYDROcodone-acetaminophen (NORCO) 5-325 MG per tablet Take 1-2 tablets by mouth every 6 (six) hours as needed. 06/09/14   Veryl Speak, MD  levETIRAcetam (KEPPRA) 500 MG tablet Take 500 mg by mouth 2 (two) times daily.    Historical Provider, MD  magnesium oxide (MAG-OX) 400 MG tablet Take 400  mg by mouth daily.    Historical Provider, MD  omeprazole (PRILOSEC) 20 MG capsule Take 20 mg by mouth 2 (two) times daily before a meal.    Historical Provider, MD  oxybutynin (DITROPAN-XL) 5 MG 24 hr tablet Take 5 mg by mouth at bedtime.    Historical Provider, MD  tamoxifen (NOLVADEX) 20 MG tablet Take 20 mg by mouth daily.    Historical Provider, MD  venlafaxine XR (EFFEXOR-XR) 75 MG 24 hr capsule Take 75 mg by mouth at bedtime.    Historical Provider, MD  vitamin B-12 (CYANOCOBALAMIN) 1000 MCG tablet Take 1,000 mcg by mouth daily.    Historical Provider, MD   BP 130/62 mmHg  Pulse 74  Temp(Src) 97.3 F (36.3 C) (Oral)   Resp 24  Ht 5\' 3"  (1.6 m)  Wt 135 lb (61.236 kg)  BMI 23.92 kg/m2  SpO2 96% Physical Exam  Constitutional: She is oriented to person, place, and time. She appears well-developed and well-nourished.  HENT:  Head: Normocephalic and atraumatic.  Right Ear: External ear normal.  Left Ear: External ear normal.  Nose: Nose normal.  Mouth/Throat: Oropharynx is clear and moist.  Eyes: Conjunctivae and EOM are normal. Pupils are equal, round, and reactive to light.  Neck: No JVD present. No tracheal deviation present. No thyromegaly present.  Cervical collar in place, no new signs of trauma noted.   Cardiovascular: Normal rate, regular rhythm and normal heart sounds.   Pulmonary/Chest: Effort normal and breath sounds normal.  Abdominal: Soft. Bowel sounds are normal.  Musculoskeletal: Normal range of motion.  Neurological: She is alert and oriented to person, place, and time. She has normal reflexes.  Some jerking and writhing movements of extremities noted  Skin: Skin is warm and dry.  Psychiatric: She has a normal mood and affect.  Nursing note and vitals reviewed.   ED Course  Procedures (including critical care time) Labs Review Labs Reviewed  CBC WITH DIFFERENTIAL  BASIC METABOLIC PANEL  URINALYSIS, ROUTINE W REFLEX MICROSCOPIC    Imaging Review Dg Chest 2 View  07/01/2014   CLINICAL DATA:  Fall with left chest injury.  EXAM: CHEST  2 VIEW  COMPARISON:  None.  FINDINGS: Mild cardiomegaly noted.  There is no evidence of focal airspace disease, pulmonary edema, suspicious pulmonary nodule/mass, pleural effusion, or pneumothorax.  Age indeterminate fractures of the lateral left 5th-9th ribs are noted.  Left breast surgical changes and clips in the left axilla are noted.  A stimulator pack overlying the left chest is noted.  IMPRESSION: Age-indeterminate fractures of the lateral left 5th-9th ribs. No evidence of pneumothorax or pleural effusion. Correlate with pain.  Mild cardiomegaly.    Electronically Signed   By: Hassan Rowan M.D.   On: 07/01/2014 15:38   Dg Shoulder Left  07/01/2014   CLINICAL DATA:  Fall left arm pain  EXAM: LEFT SHOULDER - 2+ VIEW  COMPARISON:  None.  FINDINGS: Two views of left shoulder submitted. No acute fracture or subluxation. Mild degenerative changes left AC joint. Study is limited without axillary view. Mild inferior spurring of glenoid. Surgical clips are noted in left axilla.  IMPRESSION: No gross fracture or subluxation. Mild degenerative changes left AC joint. Mild inferior spurring of glenoid.   Electronically Signed   By: Lahoma Crocker M.D.   On: 07/01/2014 15:41     EKG Interpretation None      MDM   Final diagnoses:  Fall  Dystonia  Closed stable burst fracture of first  cervical vertebra, with routine healing, subsequent encounter    Patient with no evidence of acute change.  Patient with no evidence change in neuro status from [facility].  Patient with dried blood back of head but cleaning reveals laceration not through and through.  Patient appears to have some exacerbation of her dystonia here and is treated with ativan.  Plan return [facility].     Shaune Pollack, MD 07/01/14 [phone]

## 2016-01-25 IMAGING — CT CT HEAD W/O CM
2 of 6 series · 13 of 47 positions shown, 16 images · non-contrast
Comparison: 06/08/2014

CLINICAL DATA: Fall with forehead lacerations. History of previous
neck injury. No neck pain currently.

EXAM:
CT HEAD WITHOUT CONTRAST
CT CERVICAL SPINE WITHOUT CONTRAST
TECHNIQUE: Multidetector CT imaging of the head and cervical spine was
performed following the standard protocol without intravenous
contrast. Multiplanar CT image reconstructions of the cervical spine
were also generated.

[Series 6: 2.0 mm soft tissue · axial · 0.31mm/px · z∈[-285,-141]mm · 10 of 90 slices shown, 13 images]
[im 9/90  brain]
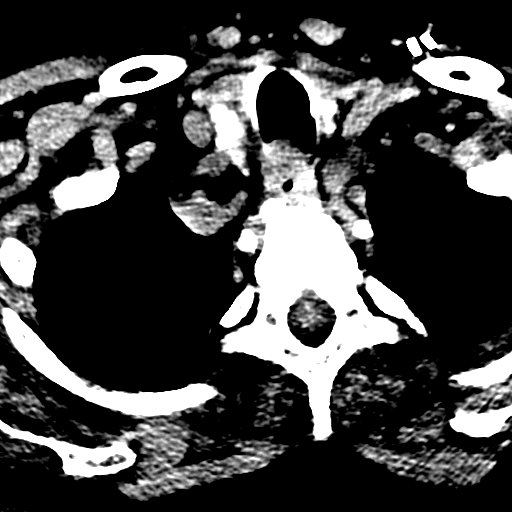
[im 9/90  bone]
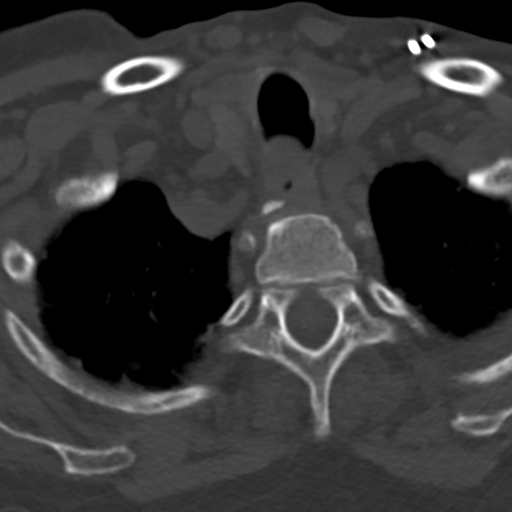
[im 17/90  brain]
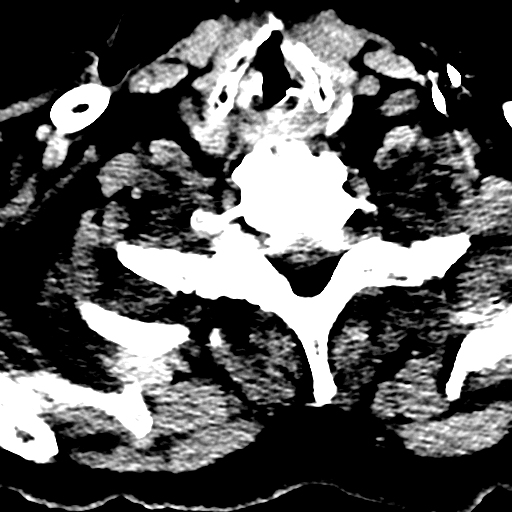
[im 25/90  brain]
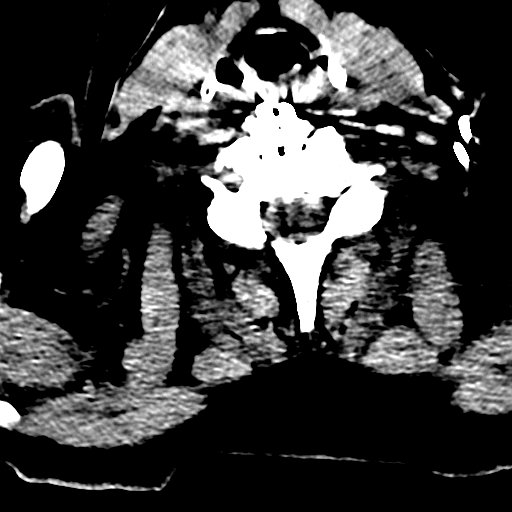
[im 33/90  brain]
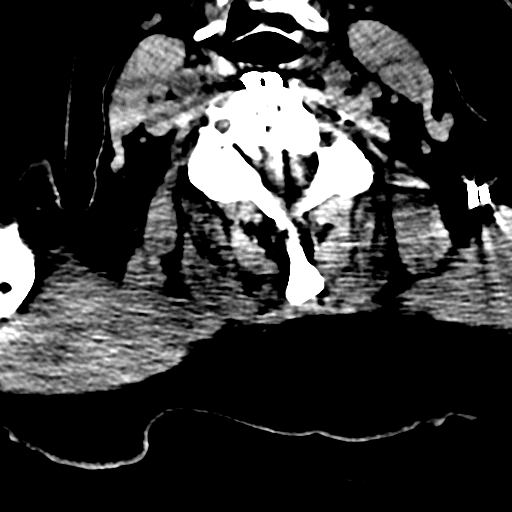
[im 41/90  brain]
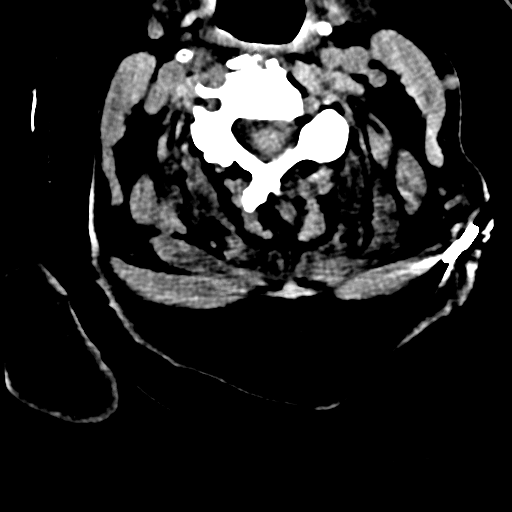
[im 41/90  bone]
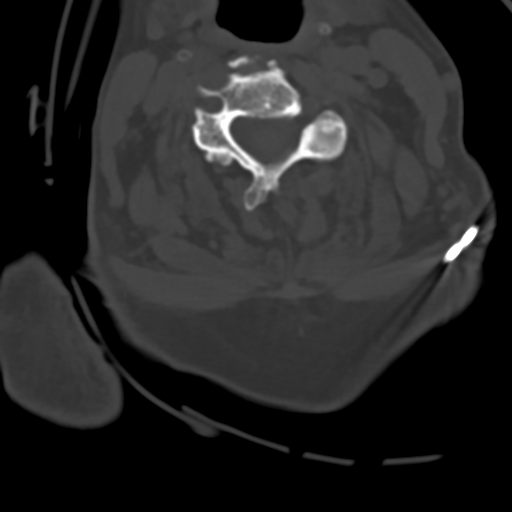
[im 49/90  brain]
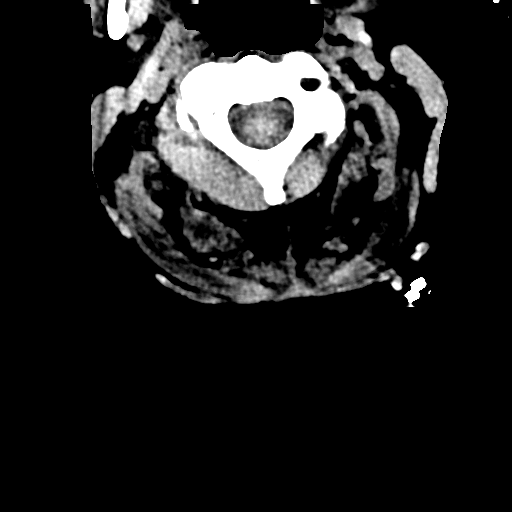
[im 57/90  brain]
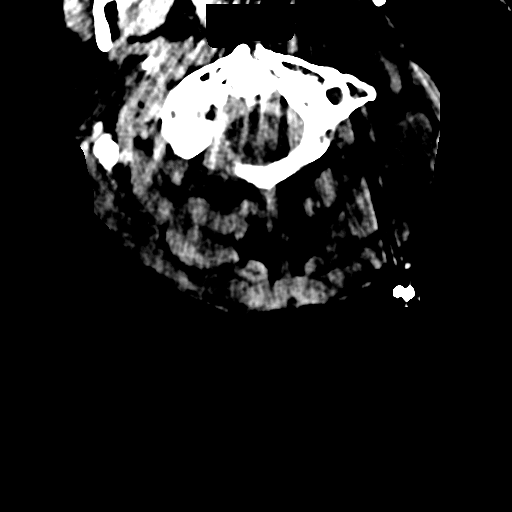
[im 65/90  brain]
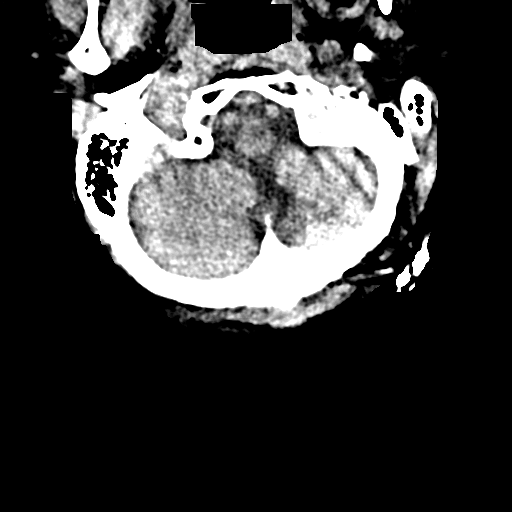
[im 73/90  brain]
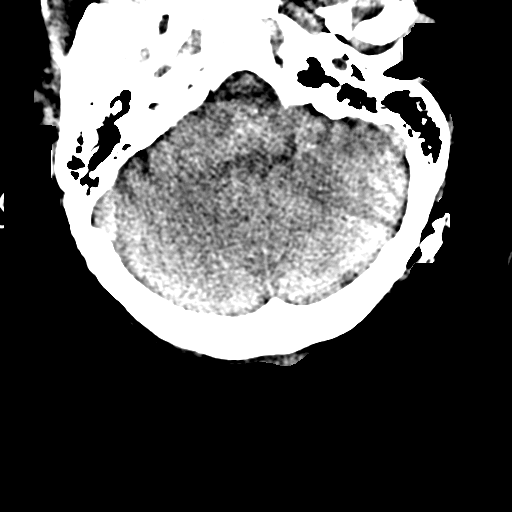
[im 73/90  bone]
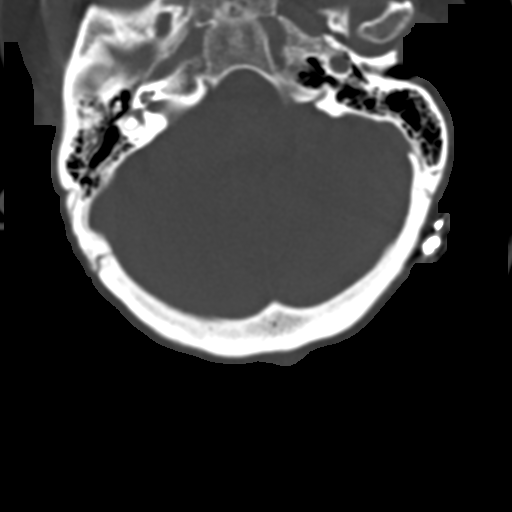
[im 81/90  brain]
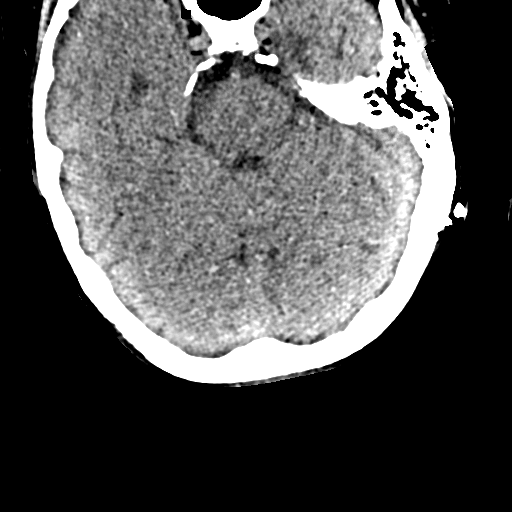

[Series 9: coronals · coronal · 0.19mm/px · 3 of 45 slices shown]
[im 15/45  brain]
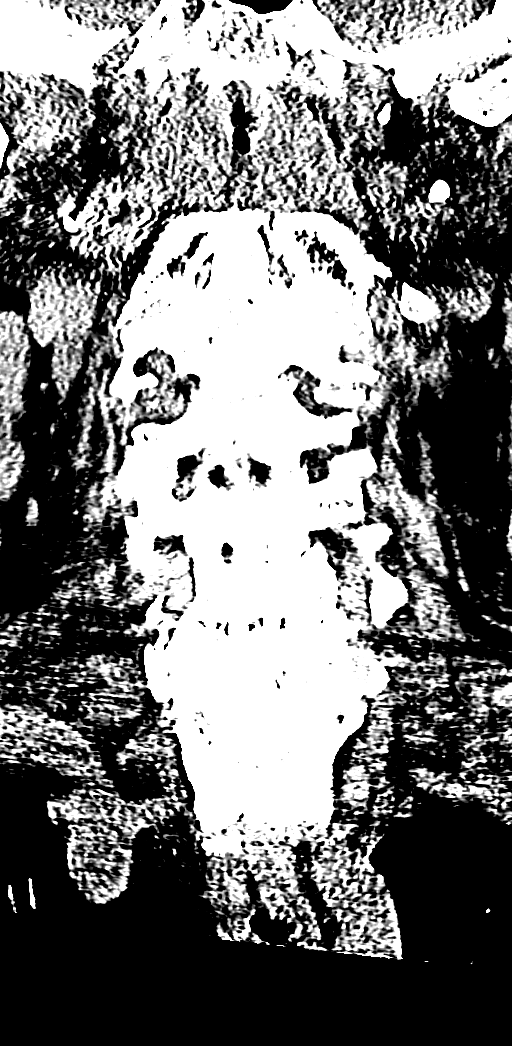
[im 20/45  brain]
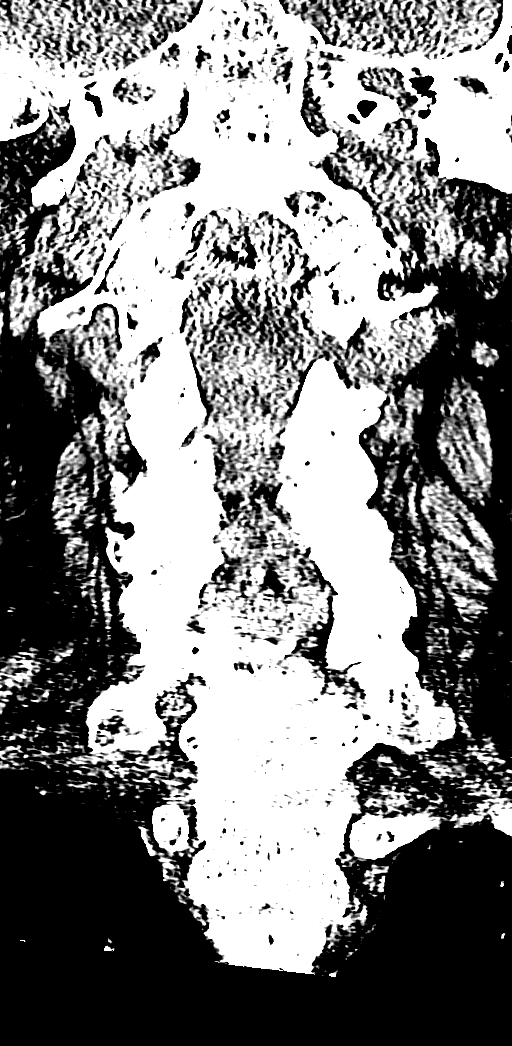
[im 25/45  brain]
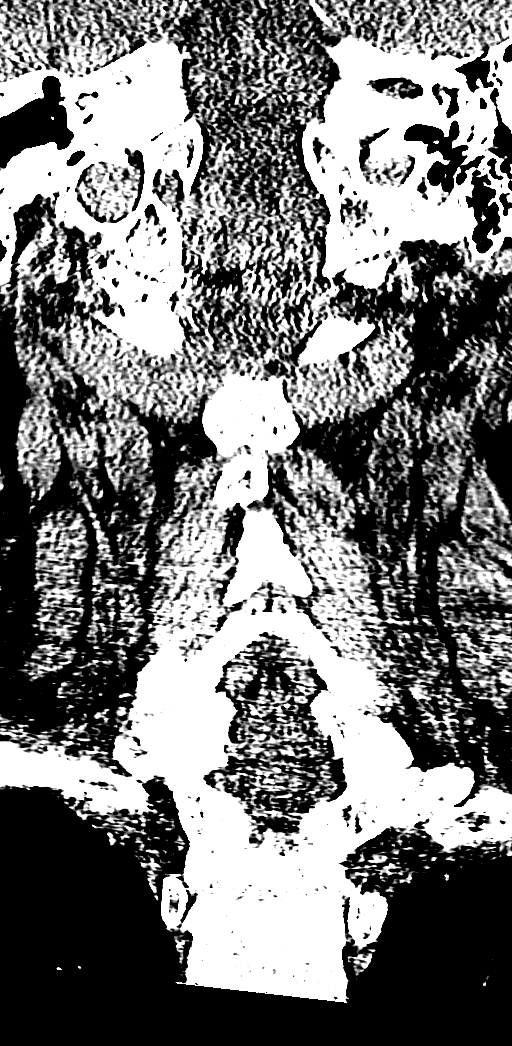

[13 of 47 positions shown; findings below may reference images not displayed]

FINDINGS: CT HEAD FINDINGS

Skull and Sinuses:Remote fracture of the right nasal arch.

Recent, nondepressed vertical fracture through the central occipital
bone, without continuation to the foramen magnum or condyles.

Bur holes for deep brain stimulator placement.

Orbits: No acute abnormality.

Brain: No evidence of acute abnormality, such as acute infarction,
hemorrhage, hydrocephalus, or mass lesion/mass effect. The deep
brain stimulator leads bilaterally appear intact on scout imaging.
No evidence of displacement. Cerebral volume loss, most prominent in
the frontal lobes.

CT CERVICAL SPINE FINDINGS

Posterior right first rib fracture.

Re- demonstrated fractures through the anterior arch and right
posterior arch of C1. Unchanged alignment, with widening of the
anterior arch of C1 by 3 mm. The fracture margins appear
chronic/corticated.

No acute fracture or traumatic malalignment identified.

C3-4 and C4-5 anterior cervical discectomy with ventral plate and
screw fixation. Bony fusion is complete. Advanced degenerative disc
disease, with marked disc narrowing at C5-6 and C6-7.
IMPRESSION: 1. When compared to 06/08/2014, no new injury identified in the head
or cervical spine.
2. Nondepressed occipital bone fracture.
3. Nondisplaced right first rib fracture.
4. Remote appearing C1 anterior and posterior ring fractures without
osseous fusion.

## 2017-01-15 ENCOUNTER — Other Ambulatory Visit: Payer: Self-pay | Admitting: Oncology

## 2020-04-17 DEATH — deceased
